# Patient Record
Sex: Female | Born: 1995
Health system: Southern US, Community
[De-identification: ages and names within clinical notes are randomized; demographics above are authoritative.]

## PROBLEM LIST (undated history)

## (undated) DIAGNOSIS — K589 Irritable bowel syndrome without diarrhea: Secondary | ICD-10-CM

## (undated) HISTORY — DX: Irritable bowel syndrome without diarrhea: K58.9

## (undated) HISTORY — DX: Irritable bowel syndrome, unspecified: K58.9

---
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
## 2014-06-20 ENCOUNTER — Ambulatory Visit (INDEPENDENT_AMBULATORY_CARE_PROVIDER_SITE_OTHER): Payer: BLUE CROSS/BLUE SHIELD | Admitting: Women's Health

## 2014-06-20 ENCOUNTER — Encounter: Payer: Self-pay | Admitting: Women's Health

## 2014-06-20 VITALS — BP 118/80 | Ht 63.0 in | Wt 127.0 lb

## 2014-06-20 DIAGNOSIS — B373 Candidiasis of vulva and vagina: Secondary | ICD-10-CM

## 2014-06-20 DIAGNOSIS — B977 Papillomavirus as the cause of diseases classified elsewhere: Secondary | ICD-10-CM

## 2014-06-20 DIAGNOSIS — B3731 Acute candidiasis of vulva and vagina: Secondary | ICD-10-CM

## 2014-06-20 DIAGNOSIS — Z01419 Encounter for gynecological examination (general) (routine) without abnormal findings: Secondary | ICD-10-CM

## 2014-06-20 DIAGNOSIS — A630 Anogenital (venereal) warts: Secondary | ICD-10-CM

## 2014-06-20 LAB — WET PREP FOR TRICH, YEAST, CLUE
Clue Cells Wet Prep HPF POC: NONE SEEN
TRICH WET PREP: NONE SEEN
YEAST WET PREP: NONE SEEN

## 2014-06-20 MED ORDER — FLUCONAZOLE 150 MG PO TABS: 150.0000 mg | ORAL_TABLET | Freq: Once | ORAL | Status: DC

## 2014-06-20 MED ORDER — IMIQUIMOD 5 % EX CREA: TOPICAL_CREAM | CUTANEOUS | Status: DC

## 2014-06-20 NOTE — Progress Notes (Signed)
Marijo FileCrystal N Tuberville 10/02/1995 [id number]    History:    Presents for annual exam.  Rare bleeding Nexplanon placed 01/2013 left upper inner arm. Gardasil series completed. [facility] with genital warts negative STD screening 05/2014.  Past medical history, past surgical history, family history and social history were all reviewed and documented in the EPIC chart. [facility] [employer] and secondary education major. Mother hypertension and hypercholesterolemia.  ROS:  A ROS was performed and pertinent positives and negatives are included.  Exam:  Filed Vitals:   06/20/14 1116  BP: 118/80    General appearance:  Normal Thyroid:  Symmetrical, normal in size, without palpable masses or nodularity. Respiratory  Auscultation:  Clear without wheezing or rhonchi Cardiovascular  Auscultation:  Regular rate, without rubs, murmurs or gallops  Edema/varicosities:  Not grossly evident Abdominal  Soft,nontender, without masses, guarding or rebound.  Liver/spleen:  No organomegaly noted  Hernia:  None appreciated  Skin  Inspection:  Grossly normal   Breasts: Examined lying and sitting.     Right: Without masses, retractions, discharge or axillary adenopathy.     Left: Without masses, retractions, discharge or axillary adenopathy. Gentitourinary   Inguinal/mons:  Normal without inguinal adenopathy  External genitalia:  1/2 cm condyloma left lower labia near introitus  BUS/Urethra/Skene's glands:  Normal  Vagina:  Normal  Cervix:  Normal  Uterus:   normal in size, shape and contour.  Midline and mobile  Adnexa/parametria:     Rt: Without masses or tenderness.   Lt: Without masses or tenderness.  Anus and perineum: Normal    Assessment/Plan:  19 y.o. SBF G0  for annual exam.     Condyloma Nexplanon- rare bleeding- 01/2013  Plan: Options reviewed, area prepped with Xylocaine jelly, small amount of TCA 80% applied, if does not resolve Aldara 5% 3 times  weekly at bedtime wash off in a.m. prescription given. If continued problems return to office. Condoms encouraged until permanent partner. SBE's, regular exercise, calcium rich diet, MVI daily, campus safety reviewed. CBC, UA,   Harrington ChallengerYOUNG,Rockie Vawter [facility] Center[facility]NP, 12:31 PM 06/20/2014

## 2014-06-20 NOTE — Patient Instructions (Signed)
Health Maintenance - 17-20 Years Old SCHOOL PERFORMANCE After high school, you may attend college or technical or vocational school, enroll in the [employer], or enter the workforce. PHYSICAL, SOCIAL, AND EMOTIONAL DEVELOPMENT  One hour of regular physical activity daily is recommended. Continue to participate in sports.  Develop your own interests and consider community service or volunteerism.  Make decisions about college and work plans.  Throughout these years, you should assume responsibility for your own health care. Increasing independence is important for you.  You may be exploring your sexual identity. Understand that you should never be in a situation that makes you feel uncomfortable, and tell your partner if you do not want to engage in sexual activity.  Body image may become important to you. Be mindful that eating disorders can develop at this time. Talk to your parents or other caregivers if you have concerns about body image, weight gain, or losing weight.  You may notice mood disturbances, depression, anxiety, attention problems, or trouble with alcohol. Talk to your health care provider if you have concerns about mental illness.  Set limits for yourself and talk with your parents or other caregivers about independent decision making.  Handle conflict without physical violence.  Avoid loud noises which may impair hearing.  Limit television and computer time to 2 hours each day. Individuals who engage in excessive inactivity are more likely to become overweight. RECOMMENDED IMMUNIZATIONS  Influenza vaccine.  All adults should be immunized every year.  All adults, including pregnant women and people with hives-only allergy to eggs, can receive the inactivated influenza (IIV) vaccine.  Adults aged 18-49 years can receive the recombinant influenza (RIV) vaccine. The RIV vaccine does not contain any egg protein.  Tetanus, diphtheria, and acellular pertussis (Td, Tdap)  vaccine.  Pregnant women should receive 1 dose of Tdap vaccine during each pregnancy. The dose should be obtained regardless of the length of time since the last dose. Immunization is preferred during the 27th to 36th week of gestation.  An adult who has not previously received Tdap or who does not know his or her vaccine status should receive 1 dose of Tdap. This initial dose should be followed by tetanus and diphtheria toxoids (Td) booster doses every 10 years.  Adults with an unknown or incomplete history of completing a 3-dose immunization series with Td-containing vaccines should begin or complete a primary immunization series including a Tdap dose.  Adults should receive a Td booster every 10 years.  Varicella vaccine.  An adult without evidence of immunity to varicella should receive 2 doses or a second dose if he or she has previously received 1 dose.  Pregnant females who do not have evidence of immunity should receive the first dose after pregnancy. This first dose should be obtained before leaving the health care facility. The second dose should be obtained 4-8 weeks after the first dose.  Human papillomavirus (HPV) vaccine.  Females aged 13-26 years who have not received the vaccine previously should obtain the 3-dose series.  The vaccine is not recommended for pregnant females. However, pregnancy testing is not needed before receiving a dose. If a female is found to be pregnant after receiving a dose, no treatment is needed. In that case, the remaining doses should be delayed until after the pregnancy.  Males aged 67-21 years who have not received the vaccine previously should receive the 3-dose series. Males aged 22-26 years may be immunized.  Immunization is recommended through the age of 40 years for any  female who has sex with males and did not get any or all doses earlier.  Immunization is recommended for any person with an immunocompromised condition through the age of 69  years if he or she did not get any or all doses earlier.  During the 3-dose series, the second dose should be obtained 4-8 weeks after the first dose. The third dose should be obtained 24 weeks after the first dose and 16 weeks after the second dose.  Measles, mumps, and rubella (MMR) vaccine.  Adults born in [birth year] or later should have 1 or more doses of MMR vaccine unless there is a contraindication to the vaccine or there is laboratory evidence of immunity to each of the three diseases.  A routine second dose of MMR vaccine should be obtained at least 28 days after the first dose for students attending postsecondary schools, health care workers, and international travelers.  For females of childbearing age, rubella immunity should be determined. If there is no evidence of immunity, females who are not pregnant should be vaccinated. If there is no evidence of immunity, females who are pregnant should delay immunization until after pregnancy.  Pneumococcal 13-valent conjugate (PCV13) vaccine.  When indicated, a person who is uncertain of his or her immunization history and has no record of immunization should receive the PCV13 vaccine.  An adult aged 31 years or older who has certain medical conditions and has not been previously immunized should receive 1 dose of PCV13 vaccine. This PCV13 should be followed with a dose of pneumococcal polysaccharide (PPSV23) vaccine. The PPSV23 vaccine dose should be obtained at least 8 weeks after the dose of PCV13 vaccine.  An adult aged 88 years or older who has certain medical conditions and previously received 1 or more doses of PPSV23 vaccine should receive 1 dose of PCV13. The PCV13 vaccine dose should be obtained 1 or more years after the last PPSV23 vaccine dose.  Pneumococcal polysaccharide (PPSV23) vaccine.  When PCV13 is also indicated, PCV13 should be obtained first.  An adult younger than age 20 years who has certain medical conditions should be  immunized.  Any person who resides in a long-term care facility should be immunized.  An adult smoker should be immunized.  People with an immunocompromised condition and certain other conditions should receive both PCV13 and PPSV23 vaccines.  People with human immunodeficiency virus (HIV) infection should be immunized as soon as possible after diagnosis.  Immunization during chemotherapy or radiation therapy should be avoided.  Routine use of PPSV23 vaccine is not recommended for American Indians, [place] Natives, or people younger than 65 years unless there are medical conditions that require PPSV23 vaccine.  When indicated, people who have unknown immunization and have no record of immunization should receive PPSV23 vaccine.  One-time revaccination 5 years after the first dose of PPSV23 is recommended for people aged 19-64 years who have chronic kidney failure, nephrotic syndrome, asplenia, or immunocompromised conditions.  Meningococcal vaccine.  Adults with asplenia or persistent complement component deficiencies should receive 2 doses of quadrivalent meningococcal conjugate (MenACWY-D) vaccine. The doses should be obtained at least 2 months apart.  Microbiologists working with certain meningococcal bacteria, [place] recruits, people at risk during an outbreak, and people who travel to or live in countries with a high rate of meningitis should be immunized.  A first-year college student up through age 60 years who is living in a residence hall should receive a dose if he or she did not receive a dose on  or after his or her 16th birthday.  Adults who have certain high-risk conditions should receive one or more doses of vaccine.  Hepatitis A vaccine.  Adults who wish to be protected from this disease, have certain high-risk conditions, work with hepatitis A-infected animals, work in hepatitis A research labs, or travel to or work in countries with a high rate of hepatitis A should be  immunized.  Adults who were previously unvaccinated and who anticipate close contact with an international adoptee during the first 60 days after arrival in the [place] from a country with a high rate of hepatitis A should be immunized.  Hepatitis B vaccine.  Adults who wish to be protected from this disease, have certain high-risk conditions, may be exposed to blood or other infectious body fluids, are household contacts or sex partners of hepatitis B positive people, are clients or workers in certain care facilities, or travel to or work in countries with a high rate of hepatitis B should be immunized.  Haemophilus influenzae type b (Hib) vaccine.  A previously unvaccinated person with asplenia or sickle cell disease or having a scheduled splenectomy should receive 1 dose of Hib vaccine.  Regardless of previous immunization, a recipient of a hematopoietic stem cell transplant should receive a 3-dose series 6-12 months after his or her successful transplant.  Hib vaccine is not recommended for adults with HIV infection. TESTING  Annual screening for vision and hearing problems is recommended. Vision should be screened at least once between 18-21 years of age.  You may be screened for anemia or tuberculosis.  You should have a blood test to check for high cholesterol.  You should be screened for alcohol and drug use.  If you are sexually active, you may be screened for sexually transmitted infections (STIs), pregnancy, or HIV. You should be screened for STIs if:  Your sexual activity has changed since the last screening test, and you are at an increased risk for chlamydia or gonorrhea. Ask your health care provider if you are at risk.  If you are at an increased risk for hepatitis B, you should be screened for this virus. You are considered at high risk for hepatitis B if you:  Were born in a country where hepatitis B occurs often. Talk with your health care provider about which  countries are considered high risk.  Have parents who were born in a high-risk country and have not received a shot to protect against hepatitis B (hepatitis B vaccine).  Have HIV or AIDS.  Use needles to inject street drugs.  Live with or have sex with someone who has hepatitis B.  Are a man who has sex with other men (MSM).  Get hemodialysis treatment.  Take certain medicines for conditions like cancer, organ transplantation, or autoimmune conditions. NUTRITION   You should:  Have three servings of low-fat milk and dairy products daily. If you do not drink milk or consume dairy products, you should eat calcium-enriched foods, such as juice, bread, or cereal. Dark, leafy greens or canned fish are alternate sources of calcium.  Drink plenty of water. Fruit juice should be limited to 8-12 oz (240-360 mL) each day. Sugary beverages and sodas should be avoided.  Avoid eating foods high in fat, salt, or sugar, such as chips, candy, and cookies.  Avoid fast foods and limit eating out at restaurants.  Try not to skip meals, especially breakfast. You should eat a variety of vegetables, fruits, and lean meats.  Eat meals   together as a family whenever possible. ORAL HEALTH Brush your teeth twice a day and floss at least once a day. You should have two dental exams a year.  SKIN CARE You should wear sunscreen when out in the sun. TALK TO SOMEONE ABOUT:  Precautions against pregnancy, contraception, and sexually transmitted infections.  Taking a prescription medicine daily to prevent HIV infection if you are at risk of being infected with HIV. This is called preexposure prophylaxis (PrEP). You are at risk if you:  Are a female who has sex with other males (MSM).  Are heterosexual and sexually active with more than one partner.  Take drugs by injection.  Are sexually active with a partner who has HIV.  Whether you are at high risk of being infected with HIV. If you choose to begin  PrEP, you should first be tested for HIV. You should then be tested every 3 months for as long as you are taking PrEP.  Drug, tobacco, and alcohol use among your friends or at friends' homes. Smoking tobacco or marijuana and taking drugs have health consequences and may impact your brain development.  Appropriate use of over-the-counter or prescription medicines.  Driving guidelines and riding with friends.  The risks of drinking and driving or boating. Call someone if you have been drinking or using drugs and need a ride. WHAT'S NEXT? Visit your pediatrician or family physician once a year. By young adulthood, you should transition from your pediatrician to a family physician or internal medicine specialist. If you are a female and are sexually active, you may want to begin annual physical exams with a gynecologist. Document Released: 08/27/2006 Document Revised: 06/06/2013 Document Reviewed: 09/16/2006 [facility] Patient Information 2015 [place], [place]. This information is not intended to replace advice given to you by your health care provider. Make sure you discuss any questions you have with your health care provider. Genital Warts Genital warts are a sexually transmitted infection. They may appear as small bumps on the tissues of the genital area. CAUSES  Genital warts are caused by a virus called human papillomavirus (HPV). HPV is the most common sexually transmitted disease (STD) and infection of the sex organs. This infection is spread by having unprotected sex with an infected person. It can be spread by vaginal, anal, and oral sex. Many people do not know they are infected. They may be infected for years without problems. However, even if they do not have problems, they can unknowingly pass the infection to their sexual partners. SYMPTOMS   Itching and irritation in the genital area.  Warts that bleed.  Painful sexual intercourse. DIAGNOSIS  Warts are usually recognized with the  naked eye on the vagina, vulva, perineum, anus, and rectum. Certain tests can also diagnose genital warts, such as:  A Pap test.  A tissue sample (biopsy) exam.  Colposcopy. A magnifying tool is used to examine the vagina and cervix. The HPV cells will change color when certain solutions are used. TREATMENT  Warts can be removed by:  Applying certain chemicals, such as cantharidin or podophyllin.  Liquid nitrogen freezing (cryotherapy).  Immunotherapy with Candida or Trichophyton injections.  Laser treatment.  Burning with an electrified probe (electrocautery).  Interferon injections.  Surgery. PREVENTION  HPV vaccination can help prevent HPV infections that cause genital warts and that cause cancer of the cervix. It is recommended that the vaccination be given to people between the ages 85 to 81 years old. The vaccine might not work as well or might  not work at all if you already have HPV. It should not be given to pregnant women. HOME CARE INSTRUCTIONS   It is important to follow your caregiver's instructions. The warts will not go away without treatment. Repeat treatments are often needed to get rid of warts. Even after it appears that the warts are gone, the normal tissue underneath often remains infected.  Do not try to treat genital warts with medicine used to treat hand warts. This type of medicine is strong and can burn the skin in the genital area, causing more damage.  Tell your past and current sexual partner(s) that you have genital warts. They may be infected also and need treatment.  Avoid sexual contact while being treated.  Do not touch or scratch the warts. The infection may spread to other parts of your body.  Women with genital warts should have a cervical cancer check (Pap test) at least once a year. This type of cancer is slow-growing and can be cured if found early. Chances of developing cervical cancer are increased with HPV.  Inform your obstetrician  about your warts in the event of pregnancy. This virus can be passed to the baby's respiratory tract. Discuss this with your caregiver.  Use a condom during sexual intercourse. Following treatment, the use of condoms will help prevent reinfection.  Ask your caregiver about using over-the-counter anti-itch creams. SEEK MEDICAL CARE IF:   Your treated skin becomes red, swollen, or painful.  You have a fever.  You feel generally ill.  You feel little lumps in and around your genital area.  You are bleeding or have painful sexual intercourse. MAKE SURE YOU:   Understand these instructions.  Will watch your condition.  Will get help right away if you are not doing well or get worse. Document Released: 05/29/2000 Document Revised: 10/16/2013 Document Reviewed: 12/08/2010 [facility] Patient Information 2015 [place], [place]. This information is not intended to replace advice given to you by your health care provider. Make sure you discuss any questions you have with your health care provider.

## 2014-06-21 LAB — URINALYSIS W MICROSCOPIC + REFLEX CULTURE
Bilirubin Urine: NEGATIVE
CASTS: NONE SEEN
Glucose, UA: NEGATIVE mg/dL
KETONES UR: NEGATIVE mg/dL
Leukocytes, UA: NEGATIVE
NITRITE: NEGATIVE
PH: 5.5 (ref 5.0–8.0)
Protein, ur: NEGATIVE mg/dL
UROBILINOGEN UA: 0.2 mg/dL (ref 0.0–1.0)

## 2014-06-22 LAB — URINE CULTURE: Colony Count: 9000

## 2017-04-30 DIAGNOSIS — H01029 Squamous blepharitis unspecified eye, unspecified eyelid: Secondary | ICD-10-CM | POA: Diagnosis not present

## 2018-06-06 DIAGNOSIS — K581 Irritable bowel syndrome with constipation: Secondary | ICD-10-CM | POA: Diagnosis not present

## 2018-06-06 DIAGNOSIS — R1310 Dysphagia, unspecified: Secondary | ICD-10-CM | POA: Diagnosis not present

## 2018-06-06 DIAGNOSIS — K5900 Constipation, unspecified: Secondary | ICD-10-CM | POA: Diagnosis not present

## 2018-12-12 ENCOUNTER — Other Ambulatory Visit: Payer: Self-pay

## 2018-12-13 ENCOUNTER — Encounter: Payer: Self-pay | Admitting: Women's Health

## 2018-12-13 ENCOUNTER — Ambulatory Visit (INDEPENDENT_AMBULATORY_CARE_PROVIDER_SITE_OTHER): Payer: BC Managed Care – PPO | Admitting: Women's Health

## 2018-12-13 VITALS — BP 122/80 | Ht 63.0 in | Wt 148.0 lb

## 2018-12-13 DIAGNOSIS — N898 Other specified noninflammatory disorders of vagina: Secondary | ICD-10-CM | POA: Diagnosis not present

## 2018-12-13 DIAGNOSIS — Z01419 Encounter for gynecological examination (general) (routine) without abnormal findings: Secondary | ICD-10-CM | POA: Diagnosis not present

## 2018-12-13 DIAGNOSIS — Z113 Encounter for screening for infections with a predominantly sexual mode of transmission: Secondary | ICD-10-CM | POA: Diagnosis not present

## 2018-12-13 LAB — WET PREP FOR TRICH, YEAST, CLUE

## 2018-12-13 MED ORDER — METRONIDAZOLE 500 MG PO TABS: 500.0000 mg | ORAL_TABLET | Freq: Two times a day (BID) | ORAL | 0 refills | Status: DC

## 2018-12-13 NOTE — Patient Instructions (Addendum)
Bacterial Vaginosis  Bacterial vaginosis is an infection of the vagina. It happens when too many normal germs (healthy bacteria) grow in the vagina. This infection puts you at risk for infections from sex (STIs). Treating this infection can lower your risk for some STIs. You should also treat this if you are pregnant. It can cause your baby to be born early. Follow these instructions at home: Medicines  Take over-the-counter and prescription medicines only as told by your doctor.  Take or use your antibiotic medicine as told by your doctor. Do not stop taking or using it even if you start to feel better. General instructions  If you your sexual partner is a woman, tell her that you have this infection. She needs to get treatment if she has symptoms. If you have a female partner, he does not need to be treated.  During treatment: ? Avoid sex. ? Do not douche. ? Avoid alcohol as told. ? Avoid breastfeeding as told.  Drink enough fluid to keep your pee (urine) clear or pale yellow.  Keep your vagina and butt (rectum) clean. ? Wash the area with warm water every day. ? Wipe from front to back after you use the toilet.  Keep all follow-up visits as told by your doctor. This is important. Preventing this condition  Do not douche.  Use only warm water to wash around your vagina.  Use protection when you have sex. This includes: ? Latex condoms. ? Dental dams.  Limit how many people you have sex with. It is best to only have sex with the same person (be monogamous).  Get tested for STIs. Have your partner get tested.  Wear underwear that is cotton or lined with cotton.  Avoid tight pants and pantyhose. This is most important in summer.  Do not use any products that have nicotine or tobacco in them. These include cigarettes and e-cigarettes. If you need help quitting, ask your doctor.  Do not use illegal drugs.  Limit how much alcohol you drink. Contact a doctor if:  Your  symptoms do not get better, even after you are treated.  You have more discharge or pain when you pee (urinate).  You have a fever.  You have pain in your belly (abdomen).  You have pain with sex.  Your bleed from your vagina between periods. Summary  This infection happens when too many germs (bacteria) grow in the vagina.  Treating this condition can lower your risk for some infections from sex (STIs).  You should also treat this if you are pregnant. It can cause early (premature) birth.  Do not stop taking or using your antibiotic medicine even if you start to feel better. This information is not intended to replace advice given to you by your health care provider. Make sure you discuss any questions you have with your health care provider. Document Released: 03/10/2008 Document Revised: 05/14/2017 Document Reviewed: 02/15/2016 Elsevier Patient Education  2020 [employer] Health Maintenance, Female Adopting a healthy lifestyle and getting preventive care are important in promoting health and wellness. Ask your health care provider about:  The right schedule for you to have regular tests and exams.  Things you can do on your own to prevent diseases and keep yourself healthy. What should I know about diet, weight, and exercise? Eat a healthy diet   Eat a diet that includes plenty of vegetables, fruits, low-fat dairy products, and lean protein.  Do not eat a lot of foods that are high in   solid fats, added sugars, or sodium. Maintain a healthy weight Body mass index (BMI) is used to identify weight problems. It estimates body fat based on height and weight. Your health care provider can help determine your BMI and help you achieve or maintain a healthy weight. Get regular exercise Get regular exercise. This is one of the most important things you can do for your health. Most adults should:  Exercise for at least 150 minutes each week. The exercise should increase your heart  rate and make you sweat (moderate-intensity exercise).  Do strengthening exercises at least twice a week. This is in addition to the moderate-intensity exercise.  Spend less time sitting. Even light physical activity can be beneficial. Watch cholesterol and blood lipids Have your blood tested for lipids and cholesterol at 24 years of age, then have this test every 5 years. Have your cholesterol levels checked more often if:  Your lipid or cholesterol levels are high.  You are older than 24 years of age.  You are at high risk for heart disease. What should I know about cancer screening? Depending on your health history and family history, you may need to have cancer screening at various ages. This may include screening for:  Breast cancer.  Cervical cancer.  Colorectal cancer.  Skin cancer.  Lung cancer. What should I know about heart disease, diabetes, and high blood pressure? Blood pressure and heart disease  High blood pressure causes heart disease and increases the risk of stroke. This is more likely to develop in people who have high blood pressure readings, are of African descent, or are overweight.  Have your blood pressure checked: ? Every 3-5 years if you are 5218-24 years of age. ? Every year if you are 24 years old or older. Diabetes Have regular diabetes screenings. This checks your fasting blood sugar level. Have the screening done:  Once every three years after age 24 if you are at a normal weight and have a low risk for diabetes.  More often and at a younger age if you are overweight or have a high risk for diabetes. What should I know about preventing infection? Hepatitis B If you have a higher risk for hepatitis B, you should be screened for this virus. Talk with your health care provider to find out if you are at risk for hepatitis B infection. Hepatitis C Testing is recommended for:  Everyone born from [birth year] through 1966.  Anyone with known risk factors  for hepatitis C. Sexually transmitted infections (STIs)  Get screened for STIs, including gonorrhea and chlamydia, if: ? You are sexually active and are younger than 24 years of age. ? You are older than 24 years of age and your health care provider tells you that you are at risk for this type of infection. ? Your sexual activity has changed since you were last screened, and you are at increased risk for chlamydia or gonorrhea. Ask your health care provider if you are at risk.  Ask your health care provider about whether you are at high risk for HIV. Your health care provider may recommend a prescription medicine to help prevent HIV infection. If you choose to take medicine to prevent HIV, you should first get tested for HIV. You should then be tested every 3 months for as long as you are taking the medicine. Pregnancy  If you are about to stop having your period (premenopausal) and you may become pregnant, seek counseling before you get pregnant.  Take 400  to 800 micrograms (mcg) of folic acid every day if you become pregnant.  Ask for birth control (contraception) if you want to prevent pregnancy. Osteoporosis and menopause Osteoporosis is a disease in which the bones lose minerals and strength with aging. This can result in bone fractures. If you are 56 years old or older, or if you are at risk for osteoporosis and fractures, ask your health care provider if you should:  Be screened for bone loss.  Take a calcium or vitamin D supplement to lower your risk of fractures.  Be given hormone replacement therapy (HRT) to treat symptoms of menopause. Follow these instructions at home: Lifestyle  Do not use any products that contain nicotine or tobacco, such as cigarettes, e-cigarettes, and chewing tobacco. If you need help quitting, ask your health care provider.  Do not use street drugs.  Do not share needles.  Ask your health care provider for help if you need support or information  about quitting drugs. Alcohol use  Do not drink alcohol if: ? Your health care provider tells you not to drink. ? You are pregnant, may be pregnant, or are planning to become pregnant.  If you drink alcohol: ? Limit how much you use to 0-1 drink a day. ? Limit intake if you are breastfeeding.  Be aware of how much alcohol is in your drink. In the [place] one drink equals one 12 oz bottle of beer (355 mL), one 5 oz glass of wine (148 mL), or one 1 oz glass of hard liquor (44 mL). General instructions  Schedule regular health, dental, and eye exams.  Stay current with your vaccines.  Tell your health care provider if: ? You often feel depressed. ? You have ever been abused or do not feel safe at home. Summary  Adopting a healthy lifestyle and getting preventive care are important in promoting health and wellness.  Follow your health care provider's instructions about healthy diet, exercising, and getting tested or screened for diseases.  Follow your health care provider's instructions on monitoring your cholesterol and blood pressure. This information is not intended to replace advice given to you by your health care provider. Make sure you discuss any questions you have with your health care provider. Document Released: 12/15/2010 Document Revised: 05/25/2018 Document Reviewed: 05/25/2018 Elsevier Patient Education  2020 [employer].

## 2018-12-13 NOTE — Progress Notes (Signed)
Brenda Sparks [birth year] [id number]    History:    Presents for annual exam.  12/2015 Nexplanon placed in [place].  Gardasil series completed.  Normal Pap history.  Same partner questionable fidelity.  Past medical history, past surgical history, family history and social history were all reviewed and documented in the EPIC chart.  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  Mother hypertension and hypercholesteremia.  ROS:  A ROS was performed and pertinent positives and negatives are included.  Exam:  Vitals:   12/13/18 1408  BP: 122/80  Weight: 148 lb (67.1 kg)  Height: 5\' 3"  (1.6 m)   Body mass index is 26.22 kg/m.   General appearance:  Normal Thyroid:  Symmetrical, normal in size, without palpable masses or nodularity. Respiratory  Auscultation:  Clear without wheezing or rhonchi Cardiovascular  Auscultation:  Regular rate, without rubs, murmurs or gallops  Edema/varicosities:  Not grossly evident Abdominal  Soft,nontender, without masses, guarding or rebound.  Liver/spleen:  No organomegaly noted  Hernia:  None appreciated  Skin  Inspection:  Grossly normal   Breasts: Examined lying and sitting.     Right: Without masses, retractions, discharge or axillary adenopathy.     Left: Without masses, retractions, discharge or axillary adenopathy. Gentitourinary   Inguinal/mons:  Normal without inguinal adenopathy  External genitalia:  Normal  BUS/Urethra/Skene's glands:  Normal  Vagina:  Normal wet prep positive for clues, TNTC bacteria no yeast  Cervix:  Normal  Uterus: normal in size, shape and contour.  Midline and mobile  Adnexa/parametria:     Rt: Without masses or tenderness.   Lt: Without masses or tenderness.  Anus and perineum: Normal    Assessment/Plan:  23 y.o. SBF G0 for annual exam with complaint of vaginal odor for the past week.  Bacterial vaginosis 12/2015 Nexplanon, amenorrheic/placed in  [place] STD screen  Plan: Schedule appointment with Dr. Phineas Real to have Nexplanon removed and replaced.  Has done well with it and would like to continue.  SBEs, exercise, calcium rich foods, MVI daily encouraged.  Congratulated on new job.  CBC, GC/chlamydia, HIV, RPR.  Pap   [place], 2:51 PM 12/13/2018

## 2018-12-13 NOTE — Addendum Note (Signed)
Addended by: Lorine Bears on: 12/13/2018 03:02 PM   Modules accepted: Orders

## 2018-12-14 LAB — PAP IG W/ RFLX HPV ASCU

## 2018-12-14 LAB — C. TRACHOMATIS/N. GONORRHOEAE RNA
C. trachomatis RNA, TMA: NOT DETECTED
N. gonorrhoeae RNA, TMA: NOT DETECTED

## 2018-12-23 ENCOUNTER — Ambulatory Visit (INDEPENDENT_AMBULATORY_CARE_PROVIDER_SITE_OTHER): Payer: BC Managed Care – PPO | Admitting: Gynecology

## 2018-12-23 ENCOUNTER — Other Ambulatory Visit: Payer: Self-pay

## 2018-12-23 ENCOUNTER — Encounter: Payer: Self-pay | Admitting: Gynecology

## 2018-12-23 VITALS — BP 118/74

## 2018-12-23 DIAGNOSIS — Z30017 Encounter for initial prescription of implantable subdermal contraceptive: Secondary | ICD-10-CM

## 2018-12-23 DIAGNOSIS — Z3046 Encounter for surveillance of implantable subdermal contraceptive: Secondary | ICD-10-CM | POA: Diagnosis not present

## 2018-12-23 NOTE — Progress Notes (Signed)
    Brenda Sparks 01/10/1996 [id number]        23 y.o.  G0P0000 presents for Nexplanon removal and reinsertion.  She is done well with her Nexplanon and would like to have it replaced.  I reviewed the Nexplanon removal and insertional process and the side effects/risks. I reviewed irregular bleeding, insertion site infections, underlying neurovascular damage with permanent sequela, migration of the implant making removal difficult requiring surgery, the need to have it removed in 3 years under a separate procedure and lastly the risk of failure with pregnancy.  Patient is right-handed.  She has read through and signed the consent form.  Procedure with Caryn Bee assistant: The left upper inner arm was examined and the Nexplanon rod was identified.  The skin overlying the distal portion was cleansed with Betadine and infiltrated with 1% lidocaine.  A small incision was made with a scalpel and through blunt and sharp dissection the Nexplanon rod was delivered through the incision and removed intact, shown to the patient and discarded.  The insertional tract was then infiltrated with 1% lidocaine and a new Nexplanon was placed according to manufacturer's recommendation without difficulty. The skin defect was closed with a Steri-Strip. The patient palpated the rod. A pressure dressing was applied and postoperative instructions give her.   Lot number:  [id number]     Brenda Auerbach MD, 10:37 AM 12/23/2018

## 2018-12-23 NOTE — Patient Instructions (Signed)
Follow-up if any issues with the Nexplanon.  Good luck with the move!

## 2019-03-06 ENCOUNTER — Encounter: Payer: Self-pay | Admitting: Gynecology
